# Patient Record
Sex: Female | Born: 1935 | Race: White | Hispanic: No | Marital: Married | Smoking: Former smoker
Health system: Southern US, Community
[De-identification: ages and names within clinical notes are randomized; demographics above are authoritative.]

## PROBLEM LIST (undated history)

## (undated) DIAGNOSIS — I10 Essential (primary) hypertension: Secondary | ICD-10-CM

## (undated) DIAGNOSIS — L120 Bullous pemphigoid: Secondary | ICD-10-CM

## (undated) HISTORY — PX: NO PAST SURGERIES: SHX2092

---

## 2009-10-04 ENCOUNTER — Emergency Department (HOSPITAL_BASED_OUTPATIENT_CLINIC_OR_DEPARTMENT_OTHER): Admission: EM | Admit: 2009-10-04 | Discharge: 2009-10-04 | Payer: Self-pay | Admitting: Emergency Medicine

## 2009-10-04 ENCOUNTER — Ambulatory Visit: Payer: Self-pay | Admitting: Interventional Radiology

## 2011-01-12 LAB — POCT CARDIAC MARKERS

## 2013-03-06 ENCOUNTER — Emergency Department (HOSPITAL_BASED_OUTPATIENT_CLINIC_OR_DEPARTMENT_OTHER): Payer: Medicare Other

## 2013-03-06 ENCOUNTER — Emergency Department (HOSPITAL_BASED_OUTPATIENT_CLINIC_OR_DEPARTMENT_OTHER)
Admission: EM | Admit: 2013-03-06 | Discharge: 2013-03-06 | Disposition: A | Discharge status: Home / Self Care | Payer: Medicare Other | Attending: Emergency Medicine | Admitting: Emergency Medicine

## 2013-03-06 ENCOUNTER — Encounter (HOSPITAL_BASED_OUTPATIENT_CLINIC_OR_DEPARTMENT_OTHER): Payer: Self-pay | Admitting: *Deleted

## 2013-03-06 DIAGNOSIS — S8000XA Contusion of unspecified knee, initial encounter: Secondary | ICD-10-CM | POA: Insufficient documentation

## 2013-03-06 DIAGNOSIS — Z79899 Other long term (current) drug therapy: Secondary | ICD-10-CM | POA: Insufficient documentation

## 2013-03-06 DIAGNOSIS — S0003XA Contusion of scalp, initial encounter: Secondary | ICD-10-CM | POA: Insufficient documentation

## 2013-03-06 DIAGNOSIS — S0990XA Unspecified injury of head, initial encounter: Secondary | ICD-10-CM

## 2013-03-06 DIAGNOSIS — W19XXXA Unspecified fall, initial encounter: Secondary | ICD-10-CM

## 2013-03-06 DIAGNOSIS — Y939 Activity, unspecified: Secondary | ICD-10-CM | POA: Insufficient documentation

## 2013-03-06 DIAGNOSIS — Y929 Unspecified place or not applicable: Secondary | ICD-10-CM | POA: Insufficient documentation

## 2013-03-06 DIAGNOSIS — W1809XA Striking against other object with subsequent fall, initial encounter: Secondary | ICD-10-CM | POA: Insufficient documentation

## 2013-03-06 DIAGNOSIS — S8002XA Contusion of left knee, initial encounter: Secondary | ICD-10-CM

## 2013-03-06 DIAGNOSIS — I10 Essential (primary) hypertension: Secondary | ICD-10-CM | POA: Insufficient documentation

## 2013-03-06 DIAGNOSIS — S0083XA Contusion of other part of head, initial encounter: Secondary | ICD-10-CM | POA: Insufficient documentation

## 2013-03-06 HISTORY — DX: Essential (primary) hypertension: I10

## 2013-03-06 NOTE — ED Notes (Addendum)
2 days ago she tripped and fell. Hit her head on the wall. Bruise to her front left forehead. No loc. Left knee bruised. She is having neck pain.

## 2013-03-06 NOTE — ED Provider Notes (Signed)
History     CSN: [id number]  Arrival date & time 03/06/13  1250   First MD Initiated Contact with Patient 03/06/13 1322      Chief Complaint  Patient presents with  . Fall    (Consider location/radiation/quality/duration/timing/severity/associated sxs/prior treatment) HPI Comments: Patient fell two days ago and hit head on the edge of the door trim and left knee on the floor.  She denies loc but does reports tenderness and pain to the left side of her head and pain and bruising to the left knee since that time.  Patient is a 79 y.o. female presenting with fall. The history is provided by the patient.  Fall This is a new problem. Episode onset: 1 1/2 days ago. The problem occurs constantly. The problem has not changed since onset.Associated symptoms include headaches. Nothing aggravates the symptoms. Nothing relieves the symptoms. She has tried nothing for the symptoms.    Past Medical History  Diagnosis Date  . Hypertension     History reviewed. No pertinent past surgical history.  No family history on file.  History  Substance Use Topics  . Smoking status: Never Smoker   . Smokeless tobacco: Not on file  . Alcohol Use: No    OB History   Grav Para Term Preterm Abortions TAB SAB Ect Mult Living                  Review of Systems  Neurological: Positive for headaches.  All other systems reviewed and are negative.    Allergies  Review of patient's allergies indicates no known allergies.  Home Medications   Current Outpatient Rx  Name  Route  Sig  Dispense  Refill  . Amodiaquine HCl Dihydrate POWD   Does not apply   by Does not apply route.         . Moexipril-Hydrochlorothiazide 15-12.5 MG TABS   Oral   Take by mouth.           BP 148/81  Pulse 60  Temp(Src) 98.4 F (36.9 C) (Oral)  Resp 18  Ht 5' (1.524 m)  Wt 132 lb (59.875 kg)  BMI 25.78 kg/m2  SpO2 99%  Physical Exam  Nursing note and vitals reviewed. Constitutional: She is oriented  to person, place, and time. She appears well-developed and well-nourished.  HENT:  Head: Normocephalic.  Mouth/Throat: Oropharynx is clear and moist.  There is bruising to the left parietal region.  I am unable to palpate any defect, however there is ttp in this region.    TM's are clear bilaterally without hemotympanum.  Eyes: EOM are normal. Pupils are equal, round, and reactive to light.  Neck: Normal range of motion. Neck supple.  Cardiovascular: Normal rate and regular rhythm.   Pulmonary/Chest: Effort normal.  Musculoskeletal:  There is swelling, ecchymosis to the left anterior knee.  It appears stable ap and laterally and there is no effusion.  No crepitus.  Neurological: She is alert and oriented to person, place, and time. No cranial nerve deficit. She exhibits normal muscle tone. Coordination normal.  Skin: Skin is warm and dry. She is not diaphoretic.    ED Course  Procedures (including critical care time)  Labs Reviewed - No data to display No results found.   No diagnosis found.    MDM  Patient presents after a fall for eval of a head injury and knee injury.  Xrays show no radiographic traumatic injury.  This appears to be a contusion to the knee and  scalp contusion.          Geoffery Lyons, MD 03/06/13 1451

## 2015-07-10 ENCOUNTER — Ambulatory Visit (INDEPENDENT_AMBULATORY_CARE_PROVIDER_SITE_OTHER): Payer: Medicare Other | Admitting: Cardiology

## 2015-07-10 ENCOUNTER — Encounter: Payer: Self-pay | Admitting: Cardiology

## 2015-07-10 VITALS — BP 178/100 | HR 59 | Ht 60.0 in | Wt 131.0 lb

## 2015-07-10 DIAGNOSIS — I10 Essential (primary) hypertension: Secondary | ICD-10-CM | POA: Diagnosis not present

## 2015-07-10 MED ORDER — AMLODIPINE BESYLATE 5 MG PO TABS: 5.0000 mg | ORAL_TABLET | Freq: Every day | ORAL | Status: DC
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
## 2015-07-10 NOTE — Assessment & Plan Note (Signed)
Patient's blood pressure is elevated.she apparently had a rash with ACE inhibitors previously. Continue ARB. Add amlodipine 5 mg daily and follow blood pressure at home. We will make further adjustments based on follow-up readings. She will bring her cuff at next office visit for correlation with ours. Lifestyle modification as well.

## 2015-07-10 NOTE — Patient Instructions (Signed)
Your physician recommends that you schedule a follow-up appointment in: 8 WEEKS WITH DR CRENSHAW  START AMLODIPINE 5 MG ONCE DAILY

## 2015-07-12 ENCOUNTER — Encounter: Payer: Self-pay | Admitting: Cardiology

## 2015-08-28 ENCOUNTER — Ambulatory Visit: Payer: No Typology Code available for payment source | Admitting: Cardiology

## 2015-10-02 ENCOUNTER — Encounter: Payer: Self-pay | Admitting: *Deleted

## 2015-10-15 NOTE — Progress Notes (Signed)
      HPI: FU hypertension. Patient has a long history of hypertension. H/O rash with ACEI. Amlodipine added at last ov. Since last seen, the patient denies any dyspnea on exertion, orthopnea, PND, pedal edema, palpitations, syncope or chest pain.   Current Outpatient Prescriptions  Medication Sig Dispense Refill  . alendronate (FOSAMAX) 70 MG tablet Take 70 mg by mouth once a week. Take with a full glass of water on an empty stomach.    Marland Kitchen. amLODipine (NORVASC) 5 MG tablet Take 1 tablet (5 mg total) by mouth daily. 180 tablet 3  . Cholecalciferol (VITAMIN D3) 50000 UNITS CAPS Take 1 capsule by mouth daily.    . Cyanocobalamin (VITAMIN B-12 PO) Take 1 tablet by mouth daily.    Marland Kitchen. losartan (COZAAR) 100 MG tablet Take 100 mg by mouth daily.    . Moexipril-Hydrochlorothiazide 15-12.5 MG TABS Take 1 tablet by mouth daily.     . Multiple Vitamins-Minerals (CENTRUM SILVER PO) Take 1 tablet by mouth daily.    Marland Kitchen. triamcinolone lotion (KENALOG) 0.1 % Apply 1 application topically 2 (two) times daily.    . vitamin C (ASCORBIC ACID) 500 MG tablet Take 500 mg by mouth daily.     No current facility-administered medications for this visit.     Past Medical History  Diagnosis Date  . Hypertension     Past Surgical History  Procedure Laterality Date  . No past surgeries      Social History   Social History  . Marital Status: Married    Spouse Name: N/A  . Number of Children: 4  . Years of Education: N/A   Occupational History  . Not on file.   Social History Main Topics  . Smoking status: Former Games developermoker  . Smokeless tobacco: Not on file  . Alcohol Use: No  . Drug Use: No  . Sexual Activity: Not on file   Other Topics Concern  . Not on file   Social History Narrative    ROS: fatigue but no fevers or chills, productive cough, hemoptysis, dysphasia, odynophagia, melena, hematochezia, dysuria, hematuria, rash, seizure activity, orthopnea, PND, pedal edema, claudication. Remaining  systems are negative.  Physical Exam: Well-developed well-nourished in no acute distress.  Skin is warm and dry.  HEENT is normal.  Neck is supple.  Chest is clear to auscultation with normal expansion.  Cardiovascular exam is regular rate and rhythm.  Abdominal exam nontender or distended. No masses palpated. Extremities show no edema. neuro grossly intact

## 2015-10-16 ENCOUNTER — Encounter: Payer: Self-pay | Admitting: Cardiology

## 2015-10-16 ENCOUNTER — Ambulatory Visit (INDEPENDENT_AMBULATORY_CARE_PROVIDER_SITE_OTHER): Payer: Medicare Other | Admitting: Cardiology

## 2015-10-16 VITALS — BP 128/72 | HR 60 | Ht 60.0 in | Wt 134.1 lb

## 2015-10-16 DIAGNOSIS — I10 Essential (primary) hypertension: Secondary | ICD-10-CM | POA: Diagnosis not present

## 2015-10-16 MED ORDER — LOSARTAN POTASSIUM 100 MG PO TABS: 100.0000 mg | ORAL_TABLET | Freq: Every day | ORAL | Status: DC

## 2015-10-16 NOTE — Assessment & Plan Note (Signed)
Patient brought records concerning her blood pressure at home. It is elevated at times. He correlated her machine with ours today and her typically runs approximately 10 points higher. She also wonders whether a rash may be related to amlodipine. However she had a milder rash prior to initiation. She is only taking 2.5 mg of amlodipine daily. She will continue on her Cozaar and we will increase amlodipine to 5 mg daily. She will follow her blood pressure at home and we will make further adjustments as needed. If her rash worsens we will change to a different antihypertensive.

## 2015-10-16 NOTE — Patient Instructions (Signed)
Medication Instructions:   TAKE AMLODIPINE 5 MG ONCE DAILY  Follow-Up:  Your physician wants you to follow-up in: 6 MONTHS WITH DR Jens SomRENSHAW You will receive a reminder letter in the mail two months in advance. If you don't receive a letter, please call our office to schedule the follow-up appointment.   If you need a refill on your cardiac medications before your next appointment, please call your pharmacy.

## 2016-07-06 NOTE — Progress Notes (Signed)
      HPI: FU hypertension. Patient has a long history of hypertension. H/O rash with ACEI. Since last seen, the patient denies any dyspnea on exertion, orthopnea, PND, pedal edema, palpitations, syncope or chest pain. She has been diagnosed with dermatologic issue which is requiring prednisone.    Current Outpatient Prescriptions  Medication Sig Dispense Refill  . alendronate (FOSAMAX) 70 MG tablet Take 70 mg by mouth once a week. Take with a full glass of water on an empty stomach.    Marland Kitchen. amLODipine (NORVASC) 5 MG tablet Take 1 tablet (5 mg total) by mouth daily. 180 tablet 3  . Cholecalciferol (VITAMIN D3) 3000 units TABS Take 3,000 Units by mouth. Take two tablets by mouth daily    . Cyanocobalamin (VITAMIN B-12 PO) Take 1 tablet by mouth daily.    . hydrOXYzine (ATARAX/VISTARIL) 25 MG tablet TAKE 1 PILL EVERY 4 TO 6 HOURS BY MOUTH AS NEEDED FOR ITCHING  0  . losartan (COZAAR) 100 MG tablet Take 1 tablet (100 mg total) by mouth daily. 90 tablet 3  . minocycline (MINOCIN,DYNACIN) 100 MG capsule Take 100 mg by mouth daily.     . Multiple Vitamins-Minerals (CENTRUM SILVER PO) Take 1 tablet by mouth daily.    . predniSONE (DELTASONE) 20 MG tablet Take 1/2 tablet by mouth every other day    . triamcinolone lotion (KENALOG) 0.1 % Apply 1 application topically 2 (two) times daily.    . vitamin C (ASCORBIC ACID) 500 MG tablet Take 500 mg by mouth daily.     No current facility-administered medications for this visit.      Past Medical History:  Diagnosis Date  . Hypertension     Past Surgical History:  Procedure Laterality Date  . NO PAST SURGERIES      Social History   Social History  . Marital status: Married    Spouse name: N/A  . Number of children: 4  . Years of education: N/A   Occupational History  . Not on file.   Social History Main Topics  . Smoking status: Former Games developermoker  . Smokeless tobacco: Not on file  . Alcohol use No  . Drug use: No  . Sexual activity: Not  on file   Other Topics Concern  . Not on file   Social History Narrative  . No narrative on file    Family History  Problem Relation Age of Onset  . Heart attack Father   . Heart attack Mother   . Stroke Mother   . Hypertension Mother   . Pneumonia Brother     BACK PROBLEMS  . Other Brother     POLIO  . Emphysema Sister   . COPD Sister     ROS: no fevers or chills, productive cough, hemoptysis, dysphasia, odynophagia, melena, hematochezia, dysuria, hematuria, rash, seizure activity, orthopnea, PND, pedal edema, claudication. Remaining systems are negative.  Physical Exam: Well-developed well-nourished in no acute distress.  Skin is warm and dry.  HEENT is normal.  Neck is supple.  Chest is clear to auscultation with normal expansion.  Cardiovascular exam is regular rate and rhythm.  Abdominal exam nontender or distended. No masses palpated. Extremities show no edema. neuro grossly intact  ECG-sinus rhythm at a rate of 70. Right bundle branch block.  A/P  1 Hypertension-blood pressure is controlled. Continue present medications. Check potassium and renal function.  Olga MillersBrian Crenshaw, MD

## 2016-07-08 ENCOUNTER — Ambulatory Visit (INDEPENDENT_AMBULATORY_CARE_PROVIDER_SITE_OTHER): Payer: Medicare Other | Admitting: Cardiology

## 2016-07-08 ENCOUNTER — Encounter: Payer: Self-pay | Admitting: Cardiology

## 2016-07-08 VITALS — BP 150/92 | HR 70 | Ht 60.0 in | Wt 143.0 lb

## 2016-07-08 DIAGNOSIS — I10 Essential (primary) hypertension: Secondary | ICD-10-CM | POA: Diagnosis not present

## 2016-07-08 MED ORDER — AMLODIPINE BESYLATE 5 MG PO TABS: 5.0000 mg | ORAL_TABLET | Freq: Every day | ORAL | 3 refills | Status: DC

## 2016-07-08 NOTE — Patient Instructions (Signed)
Medication Instructions:   NO CHANGE  Labwork:  Your physician recommends that you HAVE LAB WORK TODAY  Follow-Up:  Your physician wants you to follow-up in: ONE YEAR WITH DR CRENSHAW You will receive a reminder letter in the mail two months in advance. If you don't receive a letter, please call our office to schedule the follow-up appointment.   If you need a refill on your cardiac medications before your next appointment, please call your pharmacy.    

## 2016-07-17 ENCOUNTER — Encounter: Payer: Self-pay | Admitting: *Deleted

## 2016-07-17 LAB — BASIC METABOLIC PANEL
BUN: 24 mg/dL (ref 7–25)
CO2: 24 mmol/L (ref 20–31)
Calcium: 9.4 mg/dL (ref 8.6–10.4)
Chloride: 102 mmol/L (ref 98–110)
Creat: 1.02 mg/dL — ABNORMAL HIGH (ref 0.60–0.88)
GLUCOSE: 164 mg/dL — AB (ref 65–99)
POTASSIUM: 4.8 mmol/L (ref 3.5–5.3)
Sodium: 137 mmol/L (ref 135–146)

## 2016-09-09 ENCOUNTER — Emergency Department (HOSPITAL_BASED_OUTPATIENT_CLINIC_OR_DEPARTMENT_OTHER): Payer: Medicare Other

## 2016-09-09 ENCOUNTER — Emergency Department (HOSPITAL_BASED_OUTPATIENT_CLINIC_OR_DEPARTMENT_OTHER)
Admission: EM | Admit: 2016-09-09 | Discharge: 2016-09-09 | Disposition: A | Discharge status: Home / Self Care | Payer: Medicare Other | Attending: Emergency Medicine | Admitting: Emergency Medicine

## 2016-09-09 ENCOUNTER — Encounter (HOSPITAL_BASED_OUTPATIENT_CLINIC_OR_DEPARTMENT_OTHER): Payer: Self-pay

## 2016-09-09 DIAGNOSIS — Z87891 Personal history of nicotine dependence: Secondary | ICD-10-CM | POA: Diagnosis not present

## 2016-09-09 DIAGNOSIS — I10 Essential (primary) hypertension: Secondary | ICD-10-CM | POA: Diagnosis not present

## 2016-09-09 DIAGNOSIS — W07XXXA Fall from chair, initial encounter: Secondary | ICD-10-CM | POA: Diagnosis not present

## 2016-09-09 DIAGNOSIS — Y939 Activity, unspecified: Secondary | ICD-10-CM | POA: Diagnosis not present

## 2016-09-09 DIAGNOSIS — Y929 Unspecified place or not applicable: Secondary | ICD-10-CM | POA: Diagnosis not present

## 2016-09-09 DIAGNOSIS — Y999 Unspecified external cause status: Secondary | ICD-10-CM | POA: Diagnosis not present

## 2016-09-09 DIAGNOSIS — S29012A Strain of muscle and tendon of back wall of thorax, initial encounter: Secondary | ICD-10-CM | POA: Insufficient documentation

## 2016-09-09 DIAGNOSIS — S39012A Strain of muscle, fascia and tendon of lower back, initial encounter: Secondary | ICD-10-CM | POA: Diagnosis not present

## 2016-09-09 DIAGNOSIS — S300XXA Contusion of lower back and pelvis, initial encounter: Secondary | ICD-10-CM

## 2016-09-09 DIAGNOSIS — S29019A Strain of muscle and tendon of unspecified wall of thorax, initial encounter: Secondary | ICD-10-CM

## 2016-09-09 DIAGNOSIS — S3992XA Unspecified injury of lower back, initial encounter: Secondary | ICD-10-CM | POA: Diagnosis present

## 2016-09-09 DIAGNOSIS — W19XXXA Unspecified fall, initial encounter: Secondary | ICD-10-CM

## 2016-09-09 MED ORDER — TRAMADOL HCL 50 MG PO TABS
50.0000 mg | ORAL_TABLET | Freq: Once | ORAL | Status: AC
  Administered 2016-09-09: 50 mg via ORAL
  Filled 2016-09-09: qty 1

## 2016-09-09 MED ORDER — TRAMADOL HCL 50 MG PO TABS
50.0000 mg | ORAL_TABLET | Freq: Four times a day (QID) | ORAL | 0 refills | Status: DC | PRN
Start: 2016-09-09 — End: 2017-08-25

## 2016-09-09 NOTE — ED Notes (Signed)
Assisted up to BR, gait steady

## 2016-09-09 NOTE — ED Notes (Signed)
States she fell yesterday and landed on back and buttocks. No LOC, pain to mid back and sacral area. No obvious injury noted, gait steady.

## 2016-09-09 NOTE — ED Triage Notes (Signed)
Pt states she was seated on a bar stool yesterday- fell backwards onto the floor-pain to neck and back-slow steady gait-NAD

## 2016-09-09 NOTE — ED Provider Notes (Signed)
[facility] Provider Note   CSN: [id number] Arrival date & time: 09/09/16  1218     History   Chief Complaint Chief Complaint  Patient presents with  . Fall    HPI Tia AlertCaroline Graham is a 82 y.o. female.  Patient s/p fall from chair yesterday. Was pushing back in a chair, but legs of chair didn't move, and patient fell back/ hit right hip.  No loc.  Today, c/o pain in lower back. Constant, dull, moderate, non radiating. No numbness or weakness. Has been ambulatory. No headaches. No nv. Denies other pain or injury. No anticoag use at home.     Fall  Pertinent negatives include no chest pain, no abdominal pain, no headaches and no shortness of breath.    Past Medical History:  Diagnosis Date  . Hypertension     Patient Active Problem List   Diagnosis Date Noted  . Hypertension 07/10/2015    Past Surgical History:  Procedure Laterality Date  . NO PAST SURGERIES      OB History    No data available       Home Medications    Prior to Admission medications   Medication Sig Start Date End Date Taking? Authorizing Provider  alendronate (FOSAMAX) 70 MG tablet Take 70 mg by mouth once a week. Take with a full glass of water on an empty stomach.    Historical Provider, MD  amLODipine (NORVASC) 5 MG tablet Take 1 tablet (5 mg total) by mouth daily. 07/08/16   Lewayne BuntingBrian S Crenshaw, MD  Cholecalciferol (VITAMIN D3) 3000 units TABS Take 3,000 Units by mouth. Take two tablets by mouth daily    Historical Provider, MD  Cyanocobalamin (VITAMIN B-12 PO) Take 1 tablet by mouth daily.    Historical Provider, MD  hydrOXYzine (ATARAX/VISTARIL) 25 MG tablet TAKE 1 PILL EVERY 4 TO 6 HOURS BY MOUTH AS NEEDED FOR ITCHING 04/24/16   Historical Provider, MD  losartan (COZAAR) 100 MG tablet Take 1 tablet (100 mg total) by mouth daily. 10/16/15   Lewayne BuntingBrian S Crenshaw, MD  minocycline (MINOCIN,DYNACIN) 100 MG capsule Take 100 mg by mouth daily.  06/18/16   Historical Provider, MD  Multiple  Vitamins-Minerals (CENTRUM SILVER PO) Take 1 tablet by mouth daily.    Historical Provider, MD  predniSONE (DELTASONE) 20 MG tablet Take 1/2 tablet by mouth every other day 05/06/16   Historical Provider, MD  triamcinolone lotion (KENALOG) 0.1 % Apply 1 application topically 2 (two) times daily.    Historical Provider, MD  vitamin C (ASCORBIC ACID) 500 MG tablet Take 500 mg by mouth daily.    Historical Provider, MD    Family History Family History  Problem Relation Age of Onset  . Heart attack Father   . Heart attack Mother   . Stroke Mother   . Hypertension Mother   . Pneumonia Brother     BACK PROBLEMS  . Other Brother     POLIO  . Emphysema Sister   . COPD Sister     Social History Social History  Substance Use Topics  . Smoking status: Former Games developermoker  . Smokeless tobacco: Never Used  . Alcohol use No     Allergies   Iodine-131   Review of Systems Review of Systems  Constitutional: Negative for fever.  HENT: Negative for nosebleeds.   Eyes: Negative for visual disturbance.  Respiratory: Negative for shortness of breath.   Cardiovascular: Negative for chest pain.  Gastrointestinal: Negative for abdominal pain.  Genitourinary: Negative  for flank pain.  Musculoskeletal: Positive for back pain. Negative for neck pain.  Skin: Negative for wound.  Neurological: Negative for weakness, numbness and headaches.  Hematological: Does not bruise/bleed easily.  Psychiatric/Behavioral: Negative for confusion.     Physical Exam Updated Vital Signs BP (!) 201/107 (BP Location: Left Arm)   Pulse 60   Temp 97.7 F (36.5 C) (Oral)   Resp 18   Ht 5' (1.524 m)   Wt 64.4 kg   SpO2 96%   BMI 27.73 kg/m   Physical Exam  Constitutional: She appears well-developed and well-nourished. No distress.  HENT:  Head: Atraumatic.  Eyes: Conjunctivae are normal. Pupils are equal, round, and reactive to light. No scleral icterus.  Neck: Neck supple. No tracheal deviation present.    Cardiovascular: Normal rate, regular rhythm, normal heart sounds and intact distal pulses.   No murmur heard. Pulmonary/Chest: Effort normal and breath sounds normal. No respiratory distress. She exhibits no tenderness.  Abdominal: Normal appearance. She exhibits no distension. There is no tenderness.  Musculoskeletal: She exhibits no edema.  Lumbar tenderness, otherwise, CTLS spine, non tender, aligned, no step off. Good rom bil ext without pain or focal bony tenderness  Neurological: She is alert.  Speech clear. Motor intact bil. Ambulates w steady gait.   Skin: Skin is warm and dry. No rash noted. She is not diaphoretic.  Psychiatric: She has a normal mood and affect.  Nursing note and vitals reviewed.    ED Treatments / Results  Labs (all labs ordered are listed, but only abnormal results are displayed) Labs Reviewed - No data to display  EKG  EKG Interpretation None       Radiology No results found.  Procedures Procedures (including critical care time)  Medications Ordered in ED Medications - No data to display   Initial Impression / Assessment and Plan / ED Course  I have reviewed the triage vital signs and the nursing notes.  Pertinent labs & imaging results that were available during my care of the patient were reviewed by me and considered in my medical decision making (see chart for details).  Clinical Course     Xrays.   Reviewed nursing notes and prior charts for additional history.     Final Clinical Impressions(s) / ED Diagnoses   Final diagnoses:  None    New Prescriptions New Prescriptions   No medications on file     Cathren LaineKevin Merica Prell, MD 09/09/16 1318

## 2016-09-09 NOTE — ED Notes (Signed)
Family at bedside. 

## 2016-09-09 NOTE — Discharge Instructions (Addendum)
It was our pleasure to provide your ER care today - we hope that you feel better.  You may take advil or aleve as need for pain.  You may also take ultram as need for pain - no driving when taking.  Your blood pressure is high today - follow up with your doctor in the next 1-2 weeks.   Follow up with primary care doctor in the next 1-2 weeks if symptoms fail to improve/resolve.  Return to ER if worse, new symptoms, severe or intractable pain, other concern.

## 2016-09-09 NOTE — ED Notes (Signed)
Patient transported to X-ray 

## 2016-10-04 ENCOUNTER — Other Ambulatory Visit: Payer: Self-pay | Admitting: Cardiology

## 2016-10-04 DIAGNOSIS — I10 Essential (primary) hypertension: Secondary | ICD-10-CM

## 2016-11-01 ENCOUNTER — Other Ambulatory Visit: Payer: Self-pay | Admitting: Cardiology

## 2017-07-07 ENCOUNTER — Telehealth: Payer: Self-pay | Admitting: Cardiology

## 2017-07-07 NOTE — Telephone Encounter (Signed)
Spoke with pt, she was seen by her medical doctor and was told to call us because her bp was 157/102. She does check her bp at home and it is high at times, 160's/90's. She does report she does not take her amlodipine on a daily basis. If her bp is 127/70, she will not take it. advosed patient to restart the amlodipine 5 mg once daily. Follow up scheduled in November with dr Jens Som. She will continue to monitor bp and call if the numbers do not go down prior to follow up appointment. Pt agreed with this plan.

## 2017-07-07 NOTE — Telephone Encounter (Signed)
Pt saw her primary doctor yesterday. She was told to contact Dr Antoine Poche wanted an appointment. He did not have anything in [facility] until December.

## 2017-08-17 NOTE — Progress Notes (Signed)
HPI: FU hypertension. Patient has a long history of hypertension. H/O rash with ACEI. Since last seen, she notes increased dyspnea on exertion but no orthopnea, PND, pedal edema or chest pain.  Current Outpatient Medications  Medication Sig Dispense Refill  . alendronate (FOSAMAX) 70 MG tablet Take 70 mg by mouth once a week. Take with a full glass of water on an empty stomach.    Marland Kitchen. amLODipine (NORVASC) 5 MG tablet TAKE 1 TABLET (5 MG TOTAL) BY MOUTH DAILY. 180 tablet 2  . Cholecalciferol (VITAMIN D3) 3000 units TABS Take 3,000 Units by mouth. Take two tablets by mouth daily    . Cyanocobalamin (VITAMIN B-12 PO) Take 1 tablet by mouth daily.    Marland Kitchen. losartan (COZAAR) 100 MG tablet TAKE 1 TABLET BY MOUTH EVERY DAY 90 tablet 3  . minocycline (MINOCIN,DYNACIN) 100 MG capsule Take 100 mg by mouth daily.     Marland Kitchen. triamcinolone lotion (KENALOG) 0.1 % Apply 1 application topically 2 (two) times daily.    . vitamin C (ASCORBIC ACID) 500 MG tablet Take 500 mg by mouth daily.     No current facility-administered medications for this visit.      Past Medical History:  Diagnosis Date  . Hypertension     Past Surgical History:  Procedure Laterality Date  . NO PAST SURGERIES      Social History   Socioeconomic History  . Marital status: Married    Spouse name: Not on file  . Number of children: 4  . Years of education: Not on file  . Highest education level: Not on file  Social Needs  . Financial resource strain: Not on file  . Food insecurity - worry: Not on file  . Food insecurity - inability: Not on file  . Transportation needs - medical: Not on file  . Transportation needs - non-medical: Not on file  Occupational History  . Not on file  Tobacco Use  . Smoking status: Former Games developermoker  . Smokeless tobacco: Never Used  Substance and Sexual Activity  . Alcohol use: No    Alcohol/week: 0.0 oz  . Drug use: No  . Sexual activity: Not on file  Other Topics Concern  . Not on file    Social History Narrative  . Not on file    Family History  Problem Relation Age of Onset  . Heart attack Father   . Heart attack Mother   . Stroke Mother   . Hypertension Mother   . Pneumonia Brother        BACK PROBLEMS  . Other Brother        POLIO  . Emphysema Sister   . COPD Sister     ROS: no fevers or chills, productive cough, hemoptysis, dysphasia, odynophagia, melena, hematochezia, dysuria, hematuria, rash, seizure activity, orthopnea, PND, pedal edema, claudication. Remaining systems are negative.  Physical Exam: Well-developed well-nourished in no acute distress.  Skin is warm and dry.  HEENT is normal.  Neck is supple.  Chest is clear to auscultation with normal expansion.  Cardiovascular exam is regular rate and rhythm.  Abdominal exam nontender or distended. No masses palpated. Extremities show no edema. neuro grossly intact  ECG- sinus rhythm at a rate of 61. Right bundle branch block. personally reviewed  A/P  1 hypertension-blood pressure is mildly elevated but she states actually low at night. Continue present medications and follow. Check potassium and renal function.  2 dyspnea-patient notes increased dyspnea on exertion. Not  volume overloaded on examination. No chest pain. Check echocardiogram for LV function.  Olga MillersBrian Crenshaw, MD

## 2017-08-25 ENCOUNTER — Ambulatory Visit (INDEPENDENT_AMBULATORY_CARE_PROVIDER_SITE_OTHER): Payer: Medicare Other | Admitting: Cardiology

## 2017-08-25 ENCOUNTER — Encounter: Payer: Self-pay | Admitting: Cardiology

## 2017-08-25 VITALS — BP 149/83 | HR 61 | Ht 60.0 in | Wt 144.4 lb

## 2017-08-25 DIAGNOSIS — I10 Essential (primary) hypertension: Secondary | ICD-10-CM

## 2017-08-25 DIAGNOSIS — R0602 Shortness of breath: Secondary | ICD-10-CM

## 2017-08-25 NOTE — Patient Instructions (Signed)
Medication Instructions:   NO CHANGE  Labwork:  Your physician recommends that you HAVE LAB WORK TODAY  Testing/Procedures:  Your physician has requested that you have an echocardiogram. Echocardiography is a painless test that uses sound waves to create images of your heart. It provides your doctor with information about the size and shape of your heart and how well your heart's chambers and valves are working. This procedure takes approximately one hour. There are no restrictions for this procedure.    Follow-Up:  Your physician wants you to follow-up in: ONE YEAR WITH DR CRENSHAW You will receive a reminder letter in the mail two months in advance. If you don't receive a letter, please call our office to schedule the follow-up appointment.   If you need a refill on your cardiac medications before your next appointment, please call your pharmacy.    

## 2017-08-26 ENCOUNTER — Encounter: Payer: Self-pay | Admitting: *Deleted

## 2017-08-26 LAB — BASIC METABOLIC PANEL
BUN/Creatinine Ratio: 24 (ref 12–28)
BUN: 19 mg/dL (ref 8–27)
CALCIUM: 9.3 mg/dL (ref 8.7–10.3)
CHLORIDE: 102 mmol/L (ref 96–106)
CO2: 25 mmol/L (ref 20–29)
Creatinine, Ser: 0.78 mg/dL (ref 0.57–1.00)
GFR calc Af Amer: 79 mL/min/{1.73_m2} (ref >59)
GFR, EST NON AFRICAN AMERICAN: 69 mL/min/{1.73_m2} (ref >59)
GLUCOSE: 102 mg/dL — AB (ref 65–99)
POTASSIUM: 4.7 mmol/L (ref 3.5–5.2)
Sodium: 139 mmol/L (ref 134–144)

## 2017-09-15 ENCOUNTER — Ambulatory Visit (HOSPITAL_BASED_OUTPATIENT_CLINIC_OR_DEPARTMENT_OTHER)
Admission: RE | Admit: 2017-09-15 | Discharge: 2017-09-15 | Disposition: A | Discharge status: Home / Self Care | Payer: Medicare Other | Source: Ambulatory Visit | Attending: Cardiology | Admitting: Cardiology

## 2017-09-15 DIAGNOSIS — I340 Nonrheumatic mitral (valve) insufficiency: Secondary | ICD-10-CM | POA: Diagnosis not present

## 2017-09-15 DIAGNOSIS — R0602 Shortness of breath: Secondary | ICD-10-CM | POA: Diagnosis not present

## 2017-09-15 NOTE — Progress Notes (Signed)
Echocardiogram 2D Echocardiogram has been performed.  Jennifer Graham, Jennifer Graham M 09/15/2017, 2:17 PM

## 2017-11-17 ENCOUNTER — Other Ambulatory Visit: Payer: Self-pay | Admitting: Cardiology

## 2017-11-18 NOTE — Telephone Encounter (Signed)
Rx has been sent to the pharmacy electronically. ° °

## 2017-11-26 ENCOUNTER — Other Ambulatory Visit: Payer: Self-pay

## 2017-11-26 ENCOUNTER — Emergency Department (HOSPITAL_BASED_OUTPATIENT_CLINIC_OR_DEPARTMENT_OTHER)
Admission: EM | Admit: 2017-11-26 | Discharge: 2017-11-26 | Disposition: A | Discharge status: Home / Self Care | Payer: Medicare Other | Attending: Emergency Medicine | Admitting: Emergency Medicine

## 2017-11-26 ENCOUNTER — Encounter (HOSPITAL_BASED_OUTPATIENT_CLINIC_OR_DEPARTMENT_OTHER): Payer: Self-pay

## 2017-11-26 ENCOUNTER — Emergency Department (HOSPITAL_BASED_OUTPATIENT_CLINIC_OR_DEPARTMENT_OTHER): Payer: Medicare Other

## 2017-11-26 ENCOUNTER — Telehealth: Payer: Self-pay | Admitting: Cardiology

## 2017-11-26 DIAGNOSIS — Z79899 Other long term (current) drug therapy: Secondary | ICD-10-CM | POA: Diagnosis not present

## 2017-11-26 DIAGNOSIS — R413 Other amnesia: Secondary | ICD-10-CM | POA: Diagnosis not present

## 2017-11-26 DIAGNOSIS — K8020 Calculus of gallbladder without cholecystitis without obstruction: Secondary | ICD-10-CM | POA: Insufficient documentation

## 2017-11-26 DIAGNOSIS — E876 Hypokalemia: Secondary | ICD-10-CM | POA: Insufficient documentation

## 2017-11-26 DIAGNOSIS — R1011 Right upper quadrant pain: Secondary | ICD-10-CM | POA: Diagnosis present

## 2017-11-26 DIAGNOSIS — Z87891 Personal history of nicotine dependence: Secondary | ICD-10-CM | POA: Insufficient documentation

## 2017-11-26 DIAGNOSIS — E119 Type 2 diabetes mellitus without complications: Secondary | ICD-10-CM | POA: Insufficient documentation

## 2017-11-26 DIAGNOSIS — I10 Essential (primary) hypertension: Secondary | ICD-10-CM | POA: Insufficient documentation

## 2017-11-26 HISTORY — DX: Bullous pemphigoid: L12.0

## 2017-11-26 LAB — CBC WITH DIFFERENTIAL/PLATELET
BASOS ABS: 0 10*3/uL (ref 0.0–0.1)
Basophils Relative: 0 %
EOS PCT: 5 %
Eosinophils Absolute: 0.3 10*3/uL (ref 0.0–0.7)
HEMATOCRIT: 39.2 % (ref 36.0–46.0)
Hemoglobin: 12.9 g/dL (ref 12.0–15.0)
LYMPHS ABS: 2 10*3/uL (ref 0.7–4.0)
LYMPHS PCT: 30 %
MCH: 28.4 pg (ref 26.0–34.0)
MCHC: 32.9 g/dL (ref 30.0–36.0)
MCV: 86.2 fL (ref 78.0–100.0)
MONO ABS: 0.8 10*3/uL (ref 0.1–1.0)
MONOS PCT: 11 %
Neutro Abs: 3.6 10*3/uL (ref 1.7–7.7)
Neutrophils Relative %: 54 %
PLATELETS: 267 10*3/uL (ref 150–400)
RBC: 4.55 MIL/uL (ref 3.87–5.11)
RDW: 15.2 % (ref 11.5–15.5)
WBC: 6.7 10*3/uL (ref 4.0–10.5)

## 2017-11-26 LAB — URINALYSIS, ROUTINE W REFLEX MICROSCOPIC
BILIRUBIN URINE: NEGATIVE
GLUCOSE, UA: NEGATIVE mg/dL
Hgb urine dipstick: NEGATIVE
KETONES UR: NEGATIVE mg/dL
Leukocytes, UA: NEGATIVE
Nitrite: NEGATIVE
PH: 7 (ref 5.0–8.0)
PROTEIN: NEGATIVE mg/dL
Specific Gravity, Urine: 1.01 (ref 1.005–1.030)

## 2017-11-26 LAB — BASIC METABOLIC PANEL
Anion gap: 10 (ref 5–15)
BUN: 20 mg/dL (ref 6–20)
CO2: 23 mmol/L (ref 22–32)
Calcium: 8.8 mg/dL — ABNORMAL LOW (ref 8.9–10.3)
Chloride: 103 mmol/L (ref 101–111)
Creatinine, Ser: 0.87 mg/dL (ref 0.44–1.00)
GFR calc Af Amer: >60 mL/min (ref >60)
GFR, EST NON AFRICAN AMERICAN: 58 mL/min — AB (ref >60)
Glucose, Bld: 80 mg/dL (ref 65–99)
POTASSIUM: 3.8 mmol/L (ref 3.5–5.1)
Sodium: 136 mmol/L (ref 135–145)

## 2017-11-26 MED ORDER — AMLODIPINE BESYLATE 5 MG PO TABS
2.5000 mg | ORAL_TABLET | Freq: Once | ORAL | Status: AC
  Administered 2017-11-26: 2.5 mg via ORAL
  Filled 2017-11-26: qty 1

## 2017-11-26 NOTE — Telephone Encounter (Signed)
New message  Pt husband verbalized that he is calling for the RN  He has some questions for the rn

## 2017-11-26 NOTE — ED Triage Notes (Signed)
Pt is A/O x 4-states she has been having memory loss x 2 weeks-states she was advised by PCP over the phone to take pt to ED-pt NAD-presents to triage in w/c

## 2017-11-26 NOTE — ED Provider Notes (Signed)
[facility] EMERGENCY DEPARTMENT Provider Note   CSN: [id number] Arrival date & time: 11/26/17  1637     History   Chief Complaint Chief Complaint  Patient presents with  . Memory Loss    HPI Jennifer Graham is a 84 y.o. female with a past medical history of hypertension, who presents to ED for 2-week history of short-term memory loss.  Per patient and husband, she forgets events that occurred 2-3 days ago but has no issues with long-term memory or remembering family members.  She cannot recall any inciting event that triggered the memory loss 2 weeks ago.  No previous history of similar symptoms in the past and no history of Alzheimer's or other dementia.  According to PCP note, husband has been urging patient to come to the ED for this however she has been refusing.  She states that besides from the memory loss, she is "feeling just great."  She denies any chest pain, shortness of breath, headache, vision changes, numbness in arms or legs, changes in gait, vomiting, abdominal pain, diarrhea, fever.  Denies any injuries, falls or blood thinner use.  HPI  Past Medical History:  Diagnosis Date  . Bullous pemphigoid   . Hypertension     Patient Active Problem List   Diagnosis Date Noted  . Hypertension 07/10/2015    Past Surgical History:  Procedure Laterality Date  . NO PAST SURGERIES      OB History    No data available       Home Medications    Prior to Admission medications   Medication Sig Start Date End Date Taking? Authorizing Provider  amLODipine (NORVASC) 5 MG tablet TAKE 1 TABLET (5 MG TOTAL) BY MOUTH DAILY. 10/06/16   Lewayne Bunting, MD  Cholecalciferol (VITAMIN D3) 3000 units TABS Take 3,000 Units by mouth. Take two tablets by mouth daily    [provider]  Cyanocobalamin (VITAMIN B-12 PO) Take 1 tablet by mouth daily.    [provider]  losartan (COZAAR) 100 MG tablet TAKE 1 TABLET BY MOUTH EVERY DAY 11/18/17   Lewayne Bunting, MD  minocycline (MINOCIN,DYNACIN) 100 MG capsule Take 100 mg by mouth daily.  06/18/16   [provider]  triamcinolone lotion (KENALOG) 0.1 % Apply 1 application topically 2 (two) times daily.    [provider]  vitamin C (ASCORBIC ACID) 500 MG tablet Take 500 mg by mouth daily.    [provider]    Family History Family History  Problem Relation Age of Onset  . Heart attack Father   . Heart attack Mother   . Stroke Mother   . Hypertension Mother   . Pneumonia Brother        BACK PROBLEMS  . Other Brother        POLIO  . Emphysema Sister   . COPD Sister     Social History Social History   Tobacco Use  . Smoking status: Former Games developer  . Smokeless tobacco: Never Used  Substance Use Topics  . Alcohol use: No    Alcohol/week: 0.0 oz  . Drug use: No     Allergies   Iodine-131   Review of Systems Review of Systems  Constitutional: Negative for appetite change, chills and fever.  HENT: Negative for ear pain, rhinorrhea, sneezing and sore throat.   Eyes: Negative for photophobia and visual disturbance.  Respiratory: Negative for cough, chest tightness, shortness of breath and wheezing.   Cardiovascular: Negative for chest  pain and palpitations.  Gastrointestinal: Negative for abdominal pain, blood in stool, constipation, diarrhea, nausea and vomiting.  Genitourinary: Negative for dysuria, hematuria and urgency.  Musculoskeletal: Negative for myalgias.  Skin: Negative for rash.  Neurological: Negative for dizziness, weakness, light-headedness and headaches.       + short term memory loss x2 weeks     Physical Exam Updated Vital Signs BP (!) 193/97   Pulse (!) 55   Temp 98.3 F (36.8 C) (Oral)   Resp 15   SpO2 96%   Physical Exam  Constitutional: She is oriented to person, place, and time. She appears well-developed and well-nourished. No distress.  Nontoxic appearing and in no acute distress.  HENT:  Head: Normocephalic  and atraumatic.  Nose: Nose normal.  Eyes: Conjunctivae and EOM are normal. Left eye exhibits no discharge. No scleral icterus.  Neck: Normal range of motion. Neck supple.  Cardiovascular: Normal rate, regular rhythm, normal heart sounds and intact distal pulses. Exam reveals no gallop and no friction rub.  No murmur heard. Pulmonary/Chest: Effort normal and breath sounds normal. No respiratory distress.  Abdominal: Soft. Bowel sounds are normal. She exhibits no distension. There is no tenderness. There is no guarding.  Musculoskeletal: Normal range of motion. She exhibits no edema.  Neurological: She is alert and oriented to person, place, and time. No cranial nerve deficit or sensory deficit. She exhibits normal muscle tone. Coordination normal.  Alert and oriented to person, place, time, situation and current events. Pupils reactive. No facial asymmetry noted. Cranial nerves appear grossly intact. Sensation intact to light touch on face, BUE and BLE. Strength 5/5 in BUE and BLE. Normal finger to nose coordination bilaterally.  Negative Romberg.    Skin: Skin is warm and dry. No rash noted.  Psychiatric: She has a normal mood and affect.  Nursing note and vitals reviewed.    ED Treatments / Results  Labs (all labs ordered are listed, but only abnormal results are displayed) Labs Reviewed  BASIC METABOLIC PANEL - Abnormal; Notable for the following components:      Result Value   Calcium 8.8 (*)    GFR calc non Af Amer 58 (*)    All other components within normal limits  CBC WITH DIFFERENTIAL/PLATELET  URINALYSIS, ROUTINE W REFLEX MICROSCOPIC    EKG  EKG Interpretation None       Radiology Ct Head Wo Contrast  Result Date: 11/26/2017 CLINICAL DATA:  Short-term memory loss, tingling in the hands and feet. EXAM: CT HEAD WITHOUT CONTRAST TECHNIQUE: Contiguous axial images were obtained from the base of the skull through the vertex without intravenous contrast. COMPARISON:   03/06/2013. FINDINGS: Brain: Mild superficial and central atrophy with chronic appearing small vessel ischemic disease. No large vascular territory infarct, hemorrhage or midline shift. No intra-axial mass nor extra-axial collections. Vascular: No hyperdense vessels or unexpected calcifications. Moderate atherosclerosis at the skull base. Skull: No skull fracture or suspicious osseous lesions. Sinuses/Orbits: Clear bilateral mastoids and paranasal sinuses. Intact orbits with bilateral lens replacements. Other: None IMPRESSION: Atrophy with chronic small vessel ischemic disease. No acute intracranial abnormality. Electronically Signed   By: Tollie Eth M.D.   On: 11/26/2017 19:28    Procedures Procedures (including critical care time)  Medications Ordered in ED Medications  amLODipine (NORVASC) tablet 2.5 mg (not administered)     Initial Impression / Assessment and Plan / ED Course  I have reviewed the triage vital signs and the nursing notes.  Pertinent labs & imaging results  that were available during my care of the patient were reviewed by me and considered in my medical decision making (see chart for details).     Patient presents to ED for evaluation of 2-week history of short-term memory loss.  States that she has no problems with her long-term memory.  No previous history of similar symptoms.  Denies any head injuries or falls.  On physical exam she is overall well-appearing.  She has no deficits on her neurological exam and she is alert and oriented x4.  She denies any other symptoms at this time.  She is hypertensive to 200s systolic.  She states that when she checks her blood pressure at home, she is periodically in 200 systolic.  She reports compliance with her home amlodipine and losartan.  Lab work including CBC, BMP, urinalysis unremarkable.  CT of the head with no acute changes but did show some chronic age-related changes.  Blood pressures have improved here to 190s systolic.  Unsure what is causing her memory changes but workup today is unremarkable, patient is stable and reasonable for outpatient management.  Will refer to neurology for further evaluation and advised patient to follow-up closely with her PCP for any medication adjustments of her blood pressure medication. Patient appears stable for discharge at this time. Strict return precautions given. Patient discussed with Dr. Rhunette CroftNanavati.  Portions of this note were generated with Scientist, clinical (histocompatibility and immunogenetics)Dragon dictation software. Dictation errors may occur despite best attempts at proofreading.   Final Clinical Impressions(s) / ED Diagnoses   Final diagnoses:  Memory impairment    ED Discharge Orders        Ordered    Ambulatory referral to Neurology    Comments:  An appointment is requested in approximately: 1 week   11/26/17 2246       Dietrich PatesKhatri, Dylin Ihnen, PA-C 11/26/17 2252    Derwood KaplanNanavati, Ankit, MD 11/27/17 0100

## 2017-11-26 NOTE — ED Notes (Signed)
[facility] No changes.

## 2017-11-26 NOTE — Discharge Instructions (Signed)
Follow-up with your cardiologist and primary care provider in the next 1-2 days.  The neurologist will contact you about an appointment regarding follow-up. Continue your home medications as previously prescribed. Return to ED for worsening symptoms, chest pain, shortness of breath, head injuries or falls, trouble walking.

## 2017-11-26 NOTE — Telephone Encounter (Signed)
Spoke with pt husband, yesterday the patient had trouble remembering words and she told her husband she felt like she may have had a mini stroke. He has been trying to get the patient to go to the ER but she is refusing. He reports to me you can tell by looking at her there is something wrong. He was calling me so he could put pressure on her to go to the ER. Advised the husband to take the patient to the nearest ER asap.

## 2017-11-26 NOTE — ED Notes (Signed)
Alert, NAD, calm, interactive, resps e/u, speaking in clear complete sentences, no dyspnea noted, skin W&D, VSS, (denies: pain, HA, sob, nausea, dizziness or visual changes). [facility] Updated.

## 2017-11-26 NOTE — ED Notes (Signed)
Alert, NAD, calm, interactive, resps e/u, no dyspnea noted. Back from imaging via stretcher. Smiling.

## 2017-11-29 ENCOUNTER — Telehealth: Payer: Self-pay | Admitting: Cardiology

## 2017-11-29 NOTE — Telephone Encounter (Signed)
New message   Patient spouse calling with BP concerns. Please call  Pt c/o BP issue: STAT if pt c/o blurred vision, one-sided weakness or slurred speech  1. What are your last 5 BP readings? 165/105  2. Are you having any other symptoms (ex. Dizziness, headache, blurred vision, passed out)? NO  3. What is your BP issue? High BP

## 2017-11-29 NOTE — Telephone Encounter (Signed)
Spoke with pt and husband, the bp reading from this morning is prior to medications. She reports she has only been taking 2.5 mg of amlodipine in the evening because her bp will be 130's and she felt like she did not need it. Advised the patient to take the losartan for this morning, if after 2 hours if the bp is still above 135/85 to take 2.5 mg of the amlodipine and then tonight she needs to take the whole amlodipine 5 mg tablet. They will contact the medical doctor to follow up with her bp if continues to run high. They are getting an MRI with [facility]. They will call with further concerns.

## 2018-01-24 ENCOUNTER — Ambulatory Visit: Payer: Self-pay | Admitting: Neurology

## 2018-01-29 IMAGING — DX DG LUMBAR SPINE COMPLETE 4+V
5 series · 5 of 5 positions shown · non-contrast
Comparison: Abdomen films of 01/28/2015

CLINICAL DATA: Fell yesterday, now with low back pain

EXAM:
LUMBAR SPINE - COMPLETE 4+ VIEW

[l-spine ap]
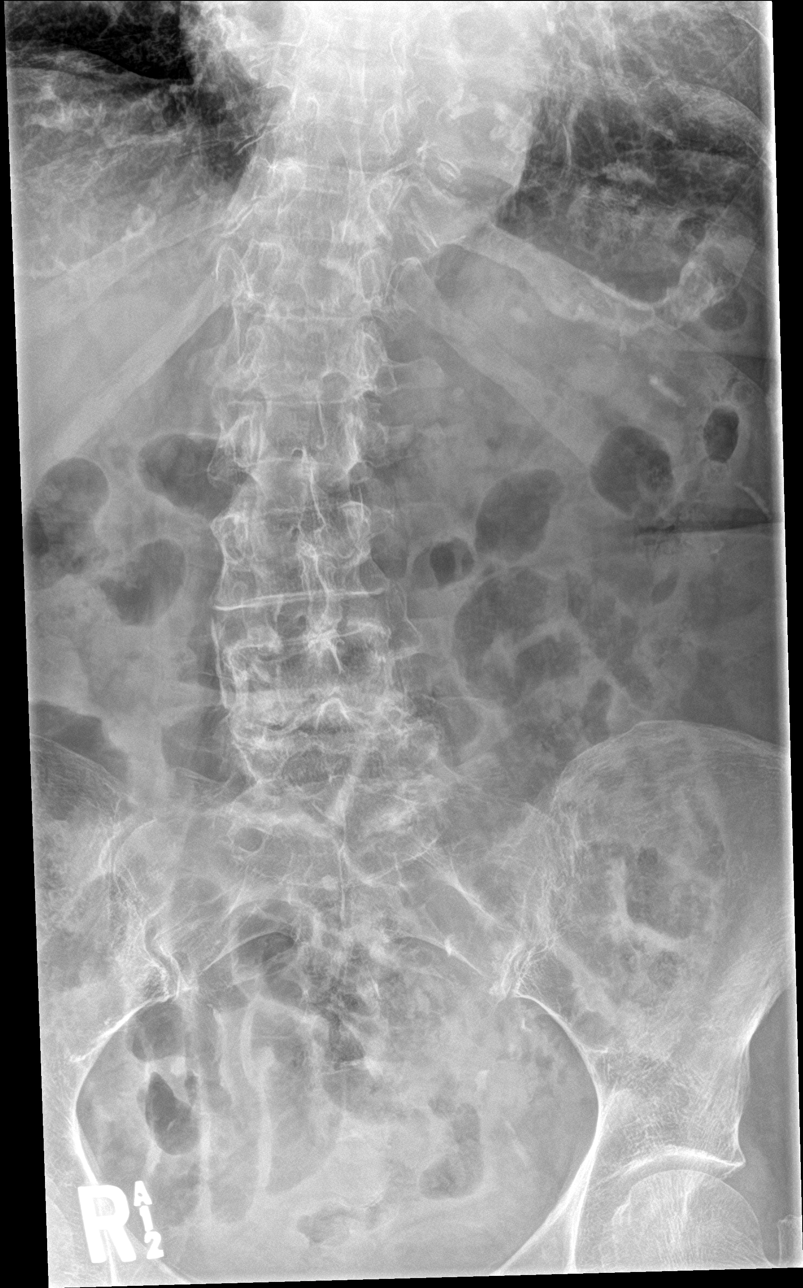

[l-spine obl (1 of 2)]
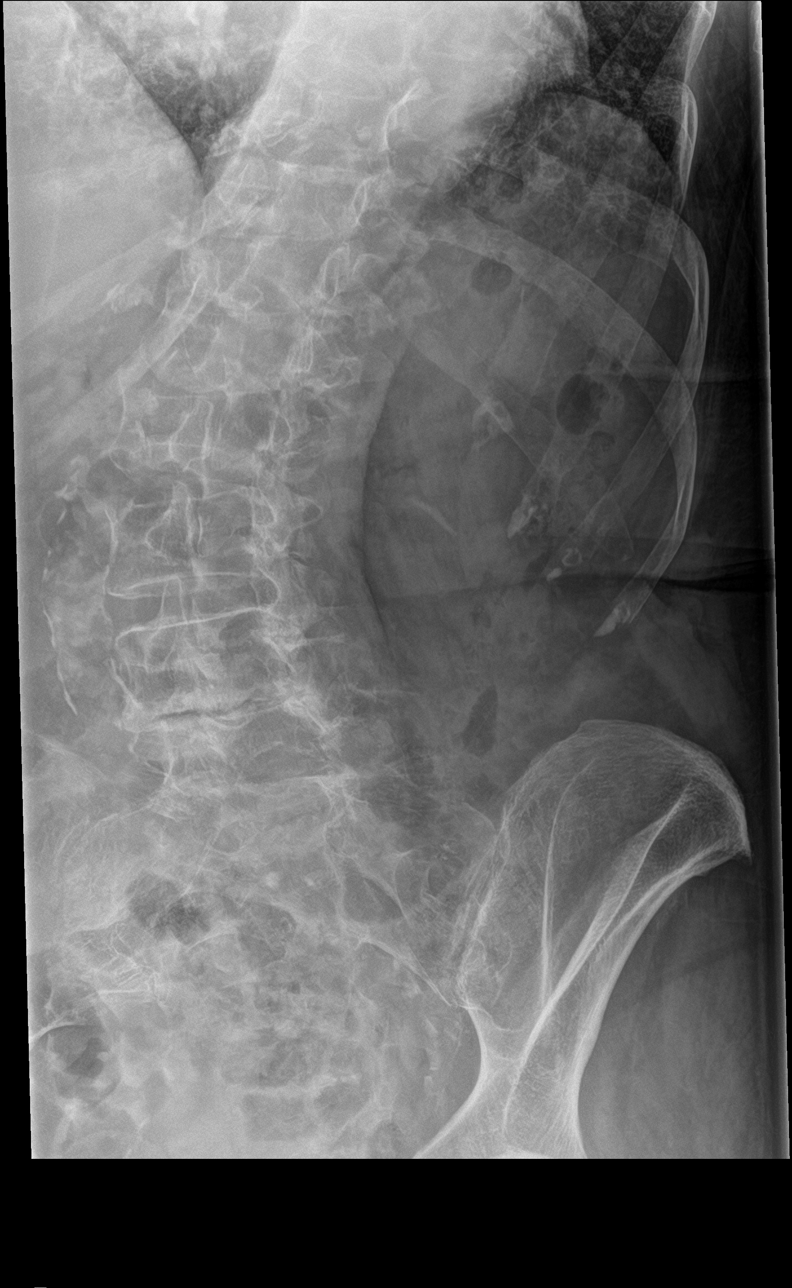

[l-spine obl (2 of 2)]
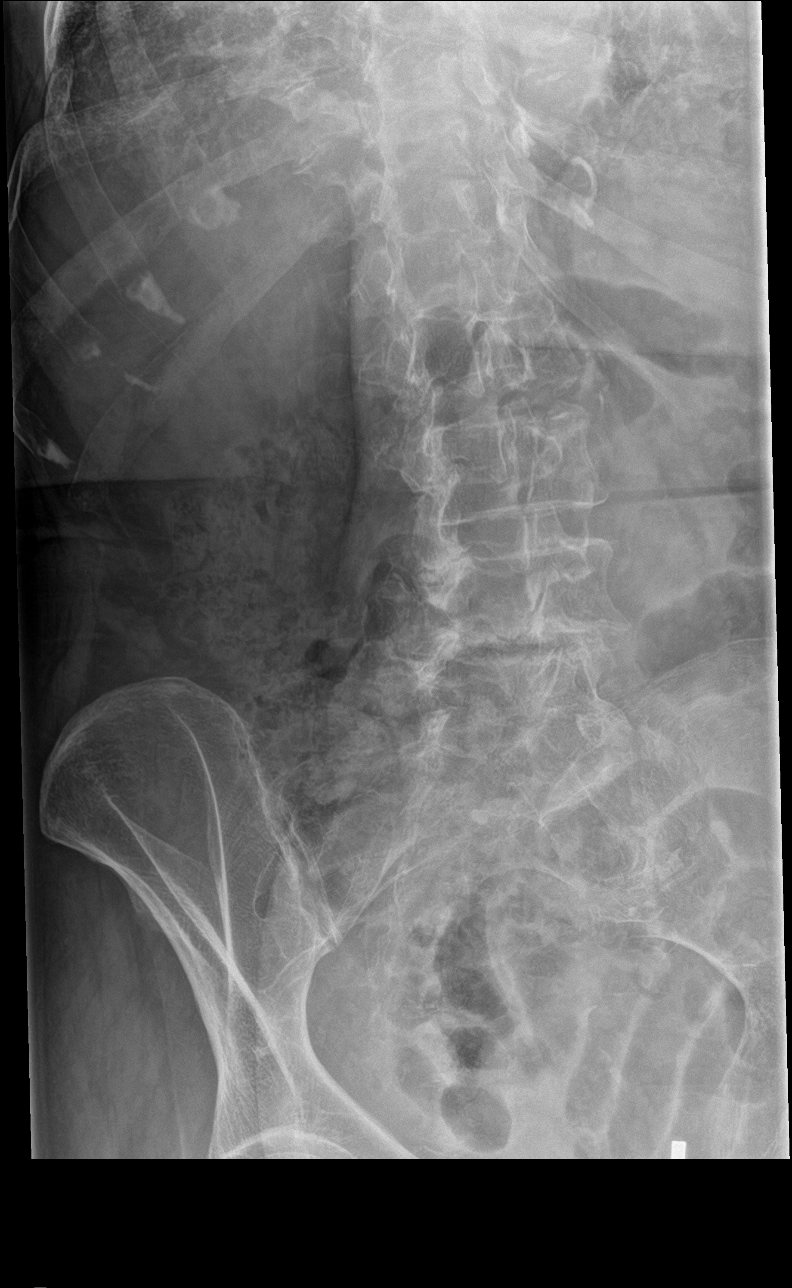

[l-spine lat]
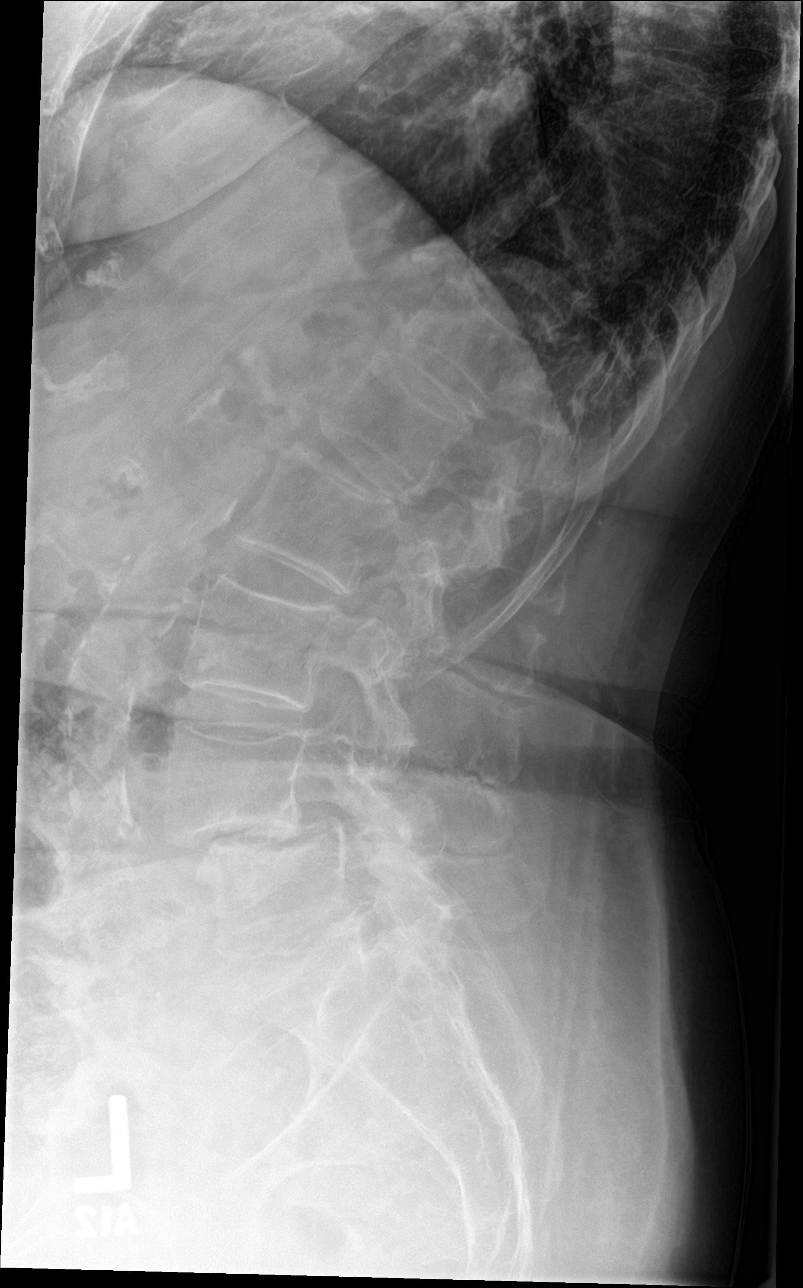

[l-spine spot]
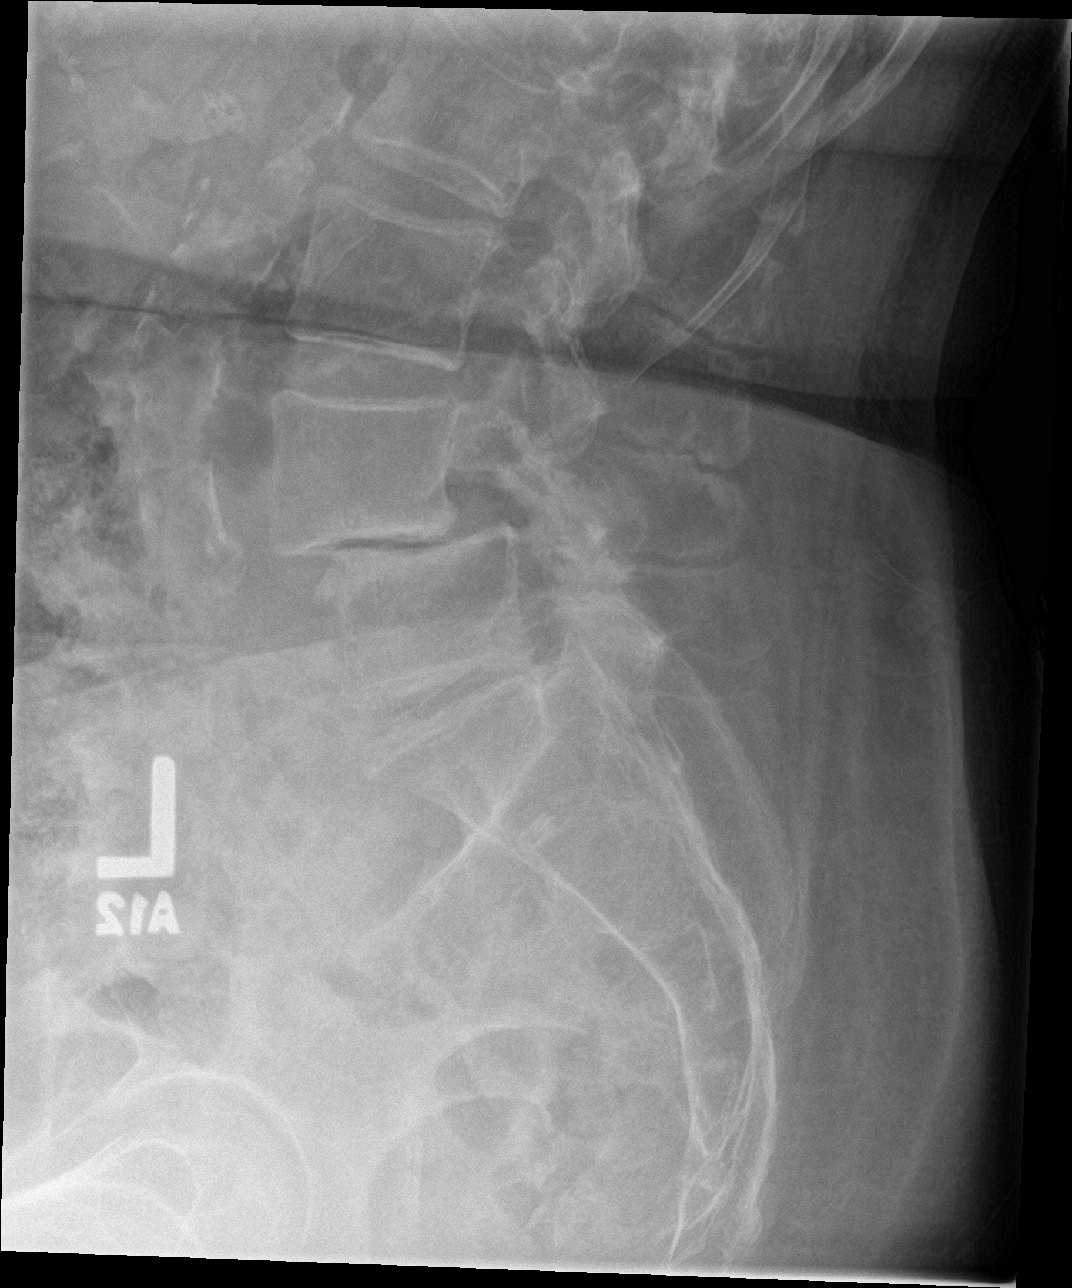

[5 of 5 positions shown; findings below may reference images not displayed]

FINDINGS: There is curvature of lumbar spine convex to the right by
approximately 12 degrees. The bones are diffusely osteopenic. There
is anterolisthesis of L4 on L5 by approximately 13 mm with
degenerative disc disease at L4-5. No pars defect is seen. Less
degenerative disc disease is noted at L5-S1, with the remainder of
intervertebral disc space is normal. No compression deformity is
seen. Moderate abdominal aortic atherosclerosis is noted.
IMPRESSION: 1. 13 mm anterolisthesis of L4 on L5 due to degenerative change of
the facet joints.
2. Degenerative disc disease at L4-5 and to a lesser degree at
L5-S1.
3. Moderate abdominal aortic atherosclerosis.

## 2018-02-04 NOTE — Telephone Encounter (Signed)
error 

## 2018-08-15 ENCOUNTER — Other Ambulatory Visit: Payer: Self-pay | Admitting: Cardiology

## 2019-02-23 ENCOUNTER — Other Ambulatory Visit: Payer: Self-pay

## 2019-02-23 MED ORDER — LOSARTAN POTASSIUM 100 MG PO TABS: 100.0000 mg | ORAL_TABLET | Freq: Every day | ORAL | 1 refills | Status: DC

## 2019-03-18 ENCOUNTER — Other Ambulatory Visit: Payer: Self-pay | Admitting: Cardiology

## 2019-03-25 ENCOUNTER — Other Ambulatory Visit: Payer: Self-pay | Admitting: Cardiology

## 2019-03-27 MED ORDER — LOSARTAN POTASSIUM 100 MG PO TABS: 100.0000 mg | ORAL_TABLET | Freq: Every day | ORAL | 0 refills | Status: DC

## 2019-03-27 NOTE — Addendum Note (Signed)
Addended byBarry Brunner on: 03/27/2019 03:05 PM   Modules accepted: Orders

## 2019-05-18 ENCOUNTER — Other Ambulatory Visit: Payer: Self-pay | Admitting: Cardiology

## 2019-05-27 ENCOUNTER — Other Ambulatory Visit: Payer: Self-pay | Admitting: Cardiology

## 2019-09-30 ENCOUNTER — Other Ambulatory Visit: Payer: Self-pay | Admitting: Cardiology

## 2019-10-17 ENCOUNTER — Other Ambulatory Visit: Payer: Self-pay | Admitting: Cardiology

## 2020-01-11 ENCOUNTER — Other Ambulatory Visit: Payer: Self-pay | Admitting: Cardiology

## 2020-06-19 ENCOUNTER — Other Ambulatory Visit: Payer: Self-pay | Admitting: Cardiology

## 2020-07-18 ENCOUNTER — Other Ambulatory Visit: Payer: Self-pay | Admitting: Cardiology

## 2020-08-20 ENCOUNTER — Other Ambulatory Visit: Payer: Self-pay | Admitting: Cardiology

## 2020-10-30 NOTE — Progress Notes (Deleted)
Referring-[facility] Reason for referral-hypertension  HPI: 87 year old female for evaluation of hypertension from [facility].  Patient seen previously but not since November 2018.  Echocardiogram December 2018 showed normal LV function, moderate left ventricular hypertrophy, grade 1 diastolic dysfunction and mild mitral regurgitation.  Patient does have a history of rash with ACE inhibitors.  Current Outpatient Medications  Medication Sig Dispense Refill  . amLODipine (NORVASC) 5 MG tablet TAKE 1 TABLET (5 MG TOTAL) BY MOUTH DAILY. 180 tablet 2  . Cholecalciferol (VITAMIN D3) 3000 units TABS Take 3,000 Units by mouth. Take two tablets by mouth daily    . Cyanocobalamin (VITAMIN B-12 PO) Take 1 tablet by mouth daily.    Marland Kitchen losartan (COZAAR) 100 MG tablet TAKE 1 TABLET BY MOUTH EVERY DAY .NEED OFFICE VISIT. 30 tablet 3  . losartan (COZAAR) 50 MG tablet Take 2 tablets (100 mg total) by mouth daily. NEED OV. 180 tablet 0  . minocycline (MINOCIN,DYNACIN) 100 MG capsule Take 100 mg by mouth daily.     Marland Kitchen triamcinolone lotion (KENALOG) 0.1 % Apply 1 application topically 2 (two) times daily.    . vitamin C (ASCORBIC ACID) 500 MG tablet Take 500 mg by mouth daily.     No current facility-administered medications for this visit.    Allergies  Allergen Reactions  . Iodine-131 Rash    Past Medical History:  Diagnosis Date  . Bullous pemphigoid   . Hypertension     Past Surgical History:  Procedure Laterality Date  . NO PAST SURGERIES      Social History   Socioeconomic History  . Marital status: Married    Spouse name: Not on file  . Number of children: 4  . Years of education: Not on file  . Highest education level: Not on file  Occupational History  . Not on file  Tobacco Use  . Smoking status: Former Games developer  . Smokeless tobacco: Never Used  Substance and Sexual Activity  . Alcohol use: No    Alcohol/week: 0.0 standard drinks  . Drug use: No  .  Sexual activity: Not on file  Other Topics Concern  . Not on file  Social History Narrative  . Not on file   Social Determinants of Health   Financial Resource Strain: Not on file  Food Insecurity: Not on file  Transportation Needs: Not on file  Physical Activity: Not on file  Stress: Not on file  Social Connections: Not on file  Intimate Partner Violence: Not on file    Family History  Problem Relation Age of Onset  . Heart attack Father   . Heart attack Mother   . Stroke Mother   . Hypertension Mother   . Pneumonia Brother        BACK PROBLEMS  . Other Brother        POLIO  . Emphysema Sister   . COPD Sister     ROS: no fevers or chills, productive cough, hemoptysis, dysphasia, odynophagia, melena, hematochezia, dysuria, hematuria, rash, seizure activity, orthopnea, PND, pedal edema, claudication. Remaining systems are negative.  Physical Exam:   There were no vitals taken for this visit.  General:  Well developed/well nourished in NAD Skin warm/dry Patient not depressed No peripheral clubbing Back-normal HEENT-normal/normal eyelids Neck supple/normal carotid upstroke bilaterally; no bruits; no JVD; no thyromegaly chest - CTA/ normal expansion CV - RRR/normal S1 and S2; no murmurs, rubs or gallops;  PMI nondisplaced Abdomen -NT/ND, no HSM, no mass, +  bowel sounds, no bruit 2+ femoral pulses, no bruits Ext-no edema, chords, 2+ DP Neuro-grossly nonfocal  ECG - personally reviewed  A/P  1 hypertension-  Olga Millers, MD

## 2020-11-06 ENCOUNTER — Ambulatory Visit: Payer: Medicare Other | Admitting: Cardiology

## 2023-01-12 ENCOUNTER — Other Ambulatory Visit: Payer: Self-pay

## 2023-01-12 DIAGNOSIS — R4189 Other symptoms and signs involving cognitive functions and awareness: Secondary | ICD-10-CM

## 2023-01-12 DIAGNOSIS — R531 Weakness: Secondary | ICD-10-CM

## 2023-01-12 DIAGNOSIS — I10 Essential (primary) hypertension: Secondary | ICD-10-CM

## 2023-04-12 DEATH — deceased
